# Patient Record
Sex: Female | Born: 2000 | Race: White | Hispanic: No | Marital: Single | Smoking: Never smoker
Health system: Southern US, Community
[De-identification: ages and names within clinical notes are randomized; demographics above are authoritative.]

## PROBLEM LIST (undated history)

## (undated) DIAGNOSIS — F419 Anxiety disorder, unspecified: Secondary | ICD-10-CM

## (undated) DIAGNOSIS — F909 Attention-deficit hyperactivity disorder, unspecified type: Secondary | ICD-10-CM

---
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
## 2022-09-30 ENCOUNTER — Other Ambulatory Visit (HOSPITAL_COMMUNITY): Payer: Self-pay

## 2022-09-30 MED ORDER — LISDEXAMFETAMINE DIMESYLATE 30 MG PO CAPS
30.0000 mg | ORAL_CAPSULE | Freq: Every morning | ORAL | 0 refills | Status: AC
  Filled 2022-09-30: qty 30, 30d supply, fill #0

## 2022-10-06 ENCOUNTER — Other Ambulatory Visit (HOSPITAL_COMMUNITY): Payer: Self-pay

## 2022-10-12 ENCOUNTER — Other Ambulatory Visit (HOSPITAL_COMMUNITY): Payer: Self-pay

## 2022-11-17 ENCOUNTER — Other Ambulatory Visit: Payer: Self-pay

## 2022-11-17 ENCOUNTER — Other Ambulatory Visit (HOSPITAL_COMMUNITY): Payer: Self-pay

## 2022-11-17 MED ORDER — LISDEXAMFETAMINE DIMESYLATE 40 MG PO CAPS
40.0000 mg | ORAL_CAPSULE | Freq: Every morning | ORAL | 0 refills | Status: AC
  Filled 2023-01-20: qty 30, 30d supply, fill #0

## 2022-11-17 MED ORDER — LISDEXAMFETAMINE DIMESYLATE 40 MG PO CAPS
40.0000 mg | ORAL_CAPSULE | Freq: Every morning | ORAL | 0 refills | Status: AC
  Filled 2022-11-17: qty 30, 30d supply, fill #0

## 2022-11-18 ENCOUNTER — Other Ambulatory Visit (HOSPITAL_COMMUNITY): Payer: Self-pay

## 2023-01-19 ENCOUNTER — Other Ambulatory Visit (HOSPITAL_COMMUNITY): Payer: Self-pay

## 2023-01-19 MED ORDER — LISDEXAMFETAMINE DIMESYLATE 40 MG PO CAPS
40.0000 mg | ORAL_CAPSULE | Freq: Every morning | ORAL | 0 refills | Status: AC
  Filled 2023-01-19: qty 30, 30d supply, fill #0

## 2023-01-21 ENCOUNTER — Other Ambulatory Visit: Payer: Self-pay

## 2023-01-21 ENCOUNTER — Other Ambulatory Visit (HOSPITAL_COMMUNITY): Payer: Self-pay

## 2023-01-24 ENCOUNTER — Other Ambulatory Visit (HOSPITAL_COMMUNITY): Payer: Self-pay

## 2023-02-23 ENCOUNTER — Other Ambulatory Visit (HOSPITAL_COMMUNITY): Payer: Self-pay

## 2023-02-23 MED ORDER — LISDEXAMFETAMINE DIMESYLATE 40 MG PO CAPS
40.0000 mg | ORAL_CAPSULE | Freq: Every morning | ORAL | 0 refills | Status: AC
  Filled 2023-02-23: qty 30, 30d supply, fill #0

## 2023-03-03 ENCOUNTER — Other Ambulatory Visit (HOSPITAL_COMMUNITY): Payer: Self-pay

## 2023-09-16 ENCOUNTER — Other Ambulatory Visit (HOSPITAL_COMMUNITY): Payer: Self-pay

## 2023-09-16 MED ORDER — LISDEXAMFETAMINE DIMESYLATE 30 MG PO CAPS
30.0000 mg | ORAL_CAPSULE | Freq: Every morning | ORAL | 0 refills | Status: AC
  Filled 2023-09-16: qty 30, 30d supply, fill #0
